# Patient Record
Sex: Female | Born: 1995 | Race: White | Hispanic: No | Marital: Single | Smoking: Current every day smoker
Health system: Southern US, Community
[De-identification: ages and names within clinical notes are randomized; demographics above are authoritative.]

## PROBLEM LIST (undated history)

## (undated) DIAGNOSIS — F419 Anxiety disorder, unspecified: Secondary | ICD-10-CM

## (undated) DIAGNOSIS — F32A Depression, unspecified: Secondary | ICD-10-CM

## (undated) DIAGNOSIS — F329 Major depressive disorder, single episode, unspecified: Secondary | ICD-10-CM

---
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
## 2015-04-29 ENCOUNTER — Ambulatory Visit
Admission: EM | Admit: 2015-04-29 | Discharge: 2015-04-29 | Disposition: A | Discharge status: Home / Self Care | Payer: Self-pay | Attending: Family Medicine | Admitting: Family Medicine

## 2015-04-29 ENCOUNTER — Ambulatory Visit (INDEPENDENT_AMBULATORY_CARE_PROVIDER_SITE_OTHER): Payer: BLUE CROSS/BLUE SHIELD

## 2015-04-29 DIAGNOSIS — S81011A Laceration without foreign body, right knee, initial encounter: Secondary | ICD-10-CM

## 2015-04-29 DIAGNOSIS — T07XXXA Unspecified multiple injuries, initial encounter: Secondary | ICD-10-CM

## 2015-04-29 DIAGNOSIS — S8001XA Contusion of right knee, initial encounter: Secondary | ICD-10-CM

## 2015-04-29 DIAGNOSIS — T148 Other injury of unspecified body region: Secondary | ICD-10-CM

## 2015-04-29 MED ORDER — TETANUS-DIPHTH-ACELL PERTUSSIS 5-2.5-18.5 LF-MCG/0.5 IM SUSP
0.5000 mL | Freq: Once | INTRAMUSCULAR | Status: AC
  Administered 2015-04-29: 0.5 mL via INTRAMUSCULAR

## 2015-04-29 MED ORDER — LIDOCAINE-EPINEPHRINE-TETRACAINE (LET) SOLUTION
3.0000 mL | Freq: Once | NASAL | Status: AC
  Administered 2015-04-29: 3 mL via TOPICAL

## 2015-04-29 MED ORDER — HYDROCODONE-ACETAMINOPHEN 5-325 MG PO TABS: 1.0000 | ORAL_TABLET | Freq: Three times a day (TID) | ORAL | Status: AC | PRN

## 2015-04-29 MED ORDER — MUPIROCIN 2 % EX OINT: 1.0000 "application " | TOPICAL_OINTMENT | Freq: Three times a day (TID) | CUTANEOUS | Status: AC

## 2015-04-29 NOTE — ED Notes (Signed)
Also has large abrasion to right lateral thigh

## 2015-04-29 NOTE — ED Notes (Signed)
States slipped on concrete this afternoon and has dime size abrasion to right knee and approximately 1cm laceration

## 2015-04-29 NOTE — ED Notes (Signed)
Boyfriend remains at bedside with patient. [facility] continues to [facility], [facility] Offered to give Toradol injection and patient asked "is that all you are going to give me?". Informed that will be given an Rx as discussed by Dr. Thurmond ButtsWade with patient.

## 2015-04-29 NOTE — ED Provider Notes (Signed)
CSN: [id number]     Arrival date & time 04/29/15  1448 History   First MD Initiated Contact with Patient 04/29/15 1632    Nurses notes were reviewed. Chief Complaint  Patient presents with  . Laceration   (Consider location/radiation/quality/duration/timing/severity/associated sxs/prior Treatment) Patient is a 19 y.o. female presenting with skin laceration. The history is provided by the patient and a significant other. No language interpreter was used.  Laceration Location:  Leg Leg laceration location:  R knee Depth:  Through dermis Quality: straight   Time since incident:  2 hours Laceration mechanism:  Fall Pain details:    Quality:  Sharp, shooting, aching and throbbing   Severity:  Severe   Timing:  Constant   Progression:  Worsening Foreign body present:  No foreign bodies Relieved by:  Nothing Ineffective treatments:  None tried Tetanus status:  Out of date   Past Medical History  Diagnosis Date  . Depression   . Anxiety    History reviewed. No pertinent past surgical history. Family History  Problem Relation Age of Onset  . Heart failure Father   . Diabetes Father   . Hypertension Father    Social History  Substance Use Topics  . Smoking status: Current Every Day Smoker -- 0.50 packs/day    Types: Cigarettes  . Smokeless tobacco: Never Used  . Alcohol Use: No   OB History    No data available     Review of Systems  Psychiatric/Behavioral: Positive for behavioral problems. The patient is nervous/anxious.   All other systems reviewed and are negative.   Allergies  Review of patient's allergies indicates no known allergies.  Home Medications   Prior to Admission medications   Medication Sig Start Date End Date Taking? Authorizing Provider  FLUoxetine (PROZAC) 20 MG tablet Take 20 mg by mouth 2 (two) times daily.    Historical Provider, MD  HYDROcodone-acetaminophen (NORCO) 5-325 MG tablet Take 1 tablet by mouth every 8 (eight) hours as needed for  moderate pain. 04/29/15   Hassan Rowan, MD  mupirocin ointment (BACTROBAN) 2 % Apply 1 application topically 3 (three) times daily. 04/29/15   Hassan Rowan, MD  naproxen (NAPROSYN) 500 MG tablet Take 1 tablet (500 mg total) by mouth 2 (two) times daily. 12/05/14   Eustace Moore, MD  norgestimate-ethinyl estradiol (ORTHO-CYCLEN,SPRINTEC,PREVIFEM) 0.25-35 MG-MCG tablet Take 1 tablet by mouth daily.    Historical Provider, MD   Meds Ordered and Administered this Visit   Medications  Tdap (BOOSTRIX) injection 0.5 mL (0.5 mLs Intramuscular Given 04/29/15 1555)  lidocaine-EPINEPHrine-tetracaine (LET) solution (3 mLs Topical Given 04/29/15 1540)    BP 116/72 mmHg  Pulse 106  Temp(Src) 97.3 F (36.3 C) (Tympanic)  Resp 16  Ht  (1.676 m)  Wt 138 lb (62.596 kg)  BMI 22.28 kg/m2  SpO2 100%  LMP 04/09/2015 No data found.   Physical Exam  Constitutional: She is oriented to person, place, and time. She appears well-developed and well-nourished.  HENT:  Head: Normocephalic.  Eyes: Pupils are equal, round, and reactive to light.  Neck: Normal range of motion.  Musculoskeletal: She exhibits tenderness.  Neurological: She is alert and oriented to person, place, and time.  Skin: Skin is warm. Abrasion and laceration noted.     Psychiatric: She has a normal mood and affect.  Vitals reviewed.   ED Course  .Marland KitchenLaceration Repair Date/Time: 04/29/2015 5:06 PM Performed by: Hassan Rowan Authorized by: Hassan Rowan Consent: Verbal consent obtained. Body area: lower extremity Location  details: right knee Laceration length: 6 cm Tendon involvement: none Nerve involvement: superficial Vascular damage: no Anesthesia: local infiltration Local anesthetic: lidocaine 1% with epinephrine and LET (lido,epi,tetracaine) Patient sedated: no Amount of cleaning: standard Debridement: none Degree of undermining: none Skin closure: staples (5) Technique: simple Approximation: close Approximation  difficulty: simple Dressing: 4x4 sterile gauze and antibiotic ointment Comments: Patient demonstrated high anxiety during the procedure removing the dressing for the left cause her to have a significant amount of anxiety and consternation. During the stapling despite after having lidocaine injected patient is a plain of significant amount of pain significant amount of fear and repeatedly asked for pain medication after the procedure was done.   (including critical care time)  Labs Review Labs Reviewed - No data to display  Imaging Review Dg Knee Complete 4 Views Right  04/29/2015  CLINICAL DATA:  19 year old female with history of trauma from falling down porch steps today with contusion and laceration on the right knee. EXAM: RIGHT KNEE - COMPLETE 4+ VIEW COMPARISON:  No priors. FINDINGS: Four views of the right knee demonstrate no acute displaced fracture, subluxation or dislocation. Multiple skin staples are noted anterior to the knee joint, presumably within the reported laceration. Small well-defined sclerotic lesion with narrow zone of transition in the proximal third of the tibial diaphysis may represent a bone island or a healed fibrous cortical defect. IMPRESSION: 1. No evidence of significant acute traumatic injury to the bones of the right knee. Electronically Signed   By: Trudie Reedaniel  Entrikin M.D.   On: 04/29/2015 17:19     Visual Acuity Review  Right Eye Distance:   Left Eye Distance:   Bilateral Distance:    Right Eye Near:   Left Eye Near:    Bilateral Near:         MDM   1. Knee contusion, right, initial encounter   2. Knee laceration, right, initial encounter   3. Abrasion, multiple sites    Patient x-rays were negative will let her go with some Vicodin and Bactroban ointment. She requested crutches which I declined at this time.      Hassan RowanEugene Baylor Teegarden, MD 04/29/15 [phone]

## 2015-04-29 NOTE — Discharge Instructions (Signed)
Cryotherapy Cryotherapy is when you put ice on your injury. Ice helps lessen pain and puffiness (swelling) after an injury. Ice works the best when you start using it in the first 24 to 48 hours after an injury. HOME CARE  Put a dry or damp towel between the ice pack and your skin.  You may press gently on the ice pack.  Leave the ice on for no more than 10 to 20 minutes at a time.  Check your skin after 5 minutes to make sure your skin is okay.  Rest at least 20 minutes between ice pack uses.  Stop using ice when your skin loses feeling (numbness).  Do not use ice on someone who cannot tell you when it hurts. This includes small children and people with memory problems (dementia). GET HELP RIGHT AWAY IF:  You have white spots on your skin.  Your skin turns blue or pale.  Your skin feels waxy or hard.  Your puffiness gets worse. MAKE SURE YOU:   Understand these instructions.  Will watch your condition.  Will get help right away if you are not doing well or get worse.   This information is not intended to replace advice given to you by your health care provider. Make sure you discuss any questions you have with your health care provider.   Document Released: 11/03/2007 Document Revised: 08/09/2011 Document Reviewed: 01/07/2011 Elsevier Interactive Patient Education 2016 [employer]  Laceration Care, Adult A laceration is a cut that goes through all layers of the skin. The cut also goes into the tissue that is right under the skin. Some cuts heal on their own. Others need to be closed with stitches (sutures), staples, skin adhesive strips, or wound glue. Taking care of your cut lowers your risk of infection and helps your cut to heal better. HOW TO TAKE CARE OF YOUR CUT For stitches or staples:  Keep the wound clean and dry.  If you were given a bandage (dressing), you should change it at least one time per day or as told by your doctor. You should also change it if it  gets wet or dirty.  Keep the wound completely dry for the first 24 hours or as told by your doctor. After that time, you may take a shower or a bath. However, make sure that the wound is not soaked in water until after the stitches or staples have been removed.  Clean the wound one time each day or as told by your doctor:  Wash the wound with soap and water.  Rinse the wound with water until all of the soap comes off.  Pat the wound dry with a clean towel. Do not rub the wound.  After you clean the wound, put a thin layer of antibiotic ointment on it as told by your doctor. This ointment:  Helps to prevent infection.  Keeps the bandage from sticking to the wound.  Have your stitches or staples removed as told by your doctor. If your doctor used skin adhesive strips:   Keep the wound clean and dry.  If you were given a bandage, you should change it at least one time per day or as told by your doctor. You should also change it if it gets dirty or wet.  Do not get the skin adhesive strips wet. You can take a shower or a bath, but be careful to keep the wound dry.  If the wound gets wet, pat it dry with a clean  towel. Do not rub the wound.  Skin adhesive strips fall off on their own. You can trim the strips as the wound heals. Do not remove any strips that are still stuck to the wound. They will fall off after a while. If your doctor used wound glue:  Try to keep your wound dry, but you may briefly wet it in the shower or bath. Do not soak the wound in water, such as by swimming.  After you take a shower or a bath, gently pat the wound dry with a clean towel. Do not rub the wound.  Do not do any activities that will make you really sweaty until the skin glue has fallen off on its own.  Do not apply liquid, cream, or ointment medicine to your wound while the skin glue is still on.  If you were given a bandage, you should change it at least one time per day or as told by your  doctor. You should also change it if it gets dirty or wet.  If a bandage is placed over the wound, do not let the tape for the bandage touch the skin glue.  Do not pick at the glue. The skin glue usually stays on for 5-10 days. Then, it falls off of the skin. General Instructions  To help prevent scarring, make sure to cover your wound with sunscreen whenever you are outside after stitches are removed, after adhesive strips are removed, or when wound glue stays in place and the wound is healed. Make sure to wear a sunscreen of at least 30 SPF.  Take over-the-counter and prescription medicines only as told by your doctor.  If you were given antibiotic medicine or ointment, take or apply it as told by your doctor. Do not stop using the antibiotic even if your wound is getting better.  Do not scratch or pick at the wound.  Keep all follow-up visits as told by your doctor. This is important.  Check your wound every day for signs of infection. Watch for:  Redness, swelling, or pain.  Fluid, blood, or pus.  Raise (elevate) the injured area above the level of your heart while you are sitting or lying down, if possible. GET HELP IF:  You got a tetanus shot and you have any of these problems at the injection site:  Swelling.  Very bad pain.  Redness.  Bleeding.  You have a fever.  A wound that was closed breaks open.  You notice a bad smell coming from your wound or your bandage.  You notice something coming out of the wound, such as wood or glass.  Medicine does not help your pain.  You have more redness, swelling, or pain at the site of your wound.  You have fluid, blood, or pus coming from your wound.  You notice a change in the color of your skin near your wound.  You need to change the bandage often because fluid, blood, or pus is coming from the wound.  You start to have a new rash.  You start to have numbness around the wound. GET HELP RIGHT AWAY IF:  You  have very bad swelling around the wound.  Your pain suddenly gets worse and is very bad.  You notice painful lumps near the wound or on skin that is anywhere on your body.  You have a red streak going away from your wound.  The wound is on your hand or foot and you cannot move a finger or  toe like you usually can.  The wound is on your hand or foot and you notice that your fingers or toes look pale or bluish.   This information is not intended to replace advice given to you by your health care provider. Make sure you discuss any questions you have with your health care provider.   Document Released: 11/03/2007 Document Revised: 10/01/2014 Document Reviewed: 05/13/2014 Elsevier Interactive Patient Education 2016 [employer]  Wound Care Taking care of your wound properly can help to prevent pain and infection. It can also help your wound to heal more quickly.  HOW TO CARE FOR YOUR WOUND  Take or apply over-the-counter and prescription medicines only as told by your health care provider.  If you were prescribed antibiotic medicine, take or apply it as told by your health care provider. Do not stop using the antibiotic even if your condition improves.  Clean the wound each day or as told by your health care provider.  Wash the wound with mild soap and water.  Rinse the wound with water to remove all soap.  Pat the wound dry with a clean towel. Do not rub it.  There are many different ways to close and cover a wound. For example, a wound can be covered with stitches (sutures), skin glue, or adhesive strips. Follow instructions from your health care provider about:  How to take care of your wound.  When and how you should change your bandage (dressing).  When you should remove your dressing.  Removing whatever was used to close your wound.  Check your wound every day for signs of infection. Watch for:  Redness, swelling, or pain.  Fluid, blood, or pus.  Keep the  dressing dry until your health care provider says it can be removed. Do not take baths, swim, use a hot tub, or do anything that would put your wound underwater until your health care provider approves.  Raise (elevate) the injured area above the level of your heart while you are sitting or lying down.  Do not scratch or pick at the wound.  Keep all follow-up visits as told by your health care provider. This is important. SEEK MEDICAL CARE IF:  You received a tetanus shot and you have swelling, severe pain, redness, or bleeding at the injection site.  You have a fever.  Your pain is not controlled with medicine.  You have increased redness, swelling, or pain at the site of your wound.  You have fluid, blood, or pus coming from your wound.  You notice a bad smell coming from your wound or your dressing. SEEK IMMEDIATE MEDICAL CARE IF:  You have a red streak going away from your wound.   This information is not intended to replace advice given to you by your health care provider. Make sure you discuss any questions you have with your health care provider.   Document Released: 02/24/2008 Document Revised: 10/01/2014 Document Reviewed: 05/13/2014 Elsevier Interactive Patient Education 2016 [employer]  Sutured Wound Care Sutures are stitches that can be used to close wounds. Taking care of your wound properly can help prevent pain and infection. It can also help your wound to heal more quickly. HOW TO CARE FOR YOUR SUTURED WOUND Wound Care  Keep the wound clean and dry.  If you were given a bandage (dressing), change it at least one time per day or as told by your doctor. You should also change it if it gets wet or dirty.  Keep the  wound completely dry for the first 24 hours or as told by your doctor. After that time, you may shower or bathe. However, make sure that the wound is not soaked in water until the sutures have been removed.  Clean the wound one time each day or as  told by your doctor.  Wash the wound with soap and water.  Rinse the wound with water to remove all soap.  Pat the wound dry with a clean towel. Do not rub the wound.  After cleaning the wound, put a thin layer of antibiotic ointment on it as told your doctor. This ointment:  Helps to prevent infection.  Keeps the bandage from sticking to the wound.  Have the sutures removed as told by your doctor. General Instructions  Take or apply medicines only as told by your doctor.  To help prevent scarring, make sure to cover your wound with sunscreen whenever you are outside after the sutures are removed and the wound is healed. Make sure to wear a sunscreen of at least 30 SPF.  If you were prescribed an antibiotic medicine or ointment, finish all of it even if you start to feel better.  Do not scratch or pick at the wound.  Keep all follow-up visits as told by your doctor. This is important.  Check your wound every day for signs of infection. Watch for:  Redness, swelling, or pain.  Fluid, blood, or pus.  Raise (elevate) the injured area above the level of your heart while you are sitting or lying down, if possible.  Avoid stretching your wound.  Drink enough fluids to keep your pee (urine) clear or pale yellow. GET HELP IF:  You were given a tetanus shot and you have any of these where the needle went in:  Swelling.  Very bad pain.  Redness.  Bleeding.  You have a fever.  A wound that was closed breaks open.  You notice a bad smell coming from the wound.  You notice something coming out of the wound, such as wood or glass.  Medicine does not help your pain.  You have any of these at the site of the wound.  More redness.  More swelling.  More pain.  You have any of these coming from the wound.  Fluid.  Blood.  Pus.  You notice a change in the color of your skin near the wound.  You need to change the bandage often due to fluid, blood, or pus  coming from the wound.  You have a new rash.  You have numbness around the wound. GET HELP RIGHT AWAY IF:  You have very bad swelling around the wound.  Your pain suddenly gets worse and is very bad.  You have painful lumps near the wound or on skin that is anywhere on your body.  You have a red streak going away from the wound.  The wound is on your hand or foot and you cannot move a finger or toe like normal.  The wound is on your hand or foot and you notice that your fingers or toes look pale or bluish.   This information is not intended to replace advice given to you by your health care provider. Make sure you discuss any questions you have with your health care provider.   Document Released: 11/03/2007 Document Revised: 10/01/2014 Document Reviewed: 12/27/2012 Elsevier Interactive Patient Education [employer]

## 2015-05-03 ENCOUNTER — Ambulatory Visit
Admission: EM | Admit: 2015-05-03 | Discharge: 2015-05-03 | Disposition: A | Discharge status: Home / Self Care | Payer: BLUE CROSS/BLUE SHIELD | Attending: Family Medicine | Admitting: Family Medicine

## 2015-05-03 ENCOUNTER — Encounter: Payer: Self-pay | Admitting: Emergency Medicine

## 2015-05-03 DIAGNOSIS — S81001D Unspecified open wound, right knee, subsequent encounter: Secondary | ICD-10-CM | POA: Diagnosis not present

## 2015-05-03 NOTE — ED Provider Notes (Addendum)
CSN: [id number]     Arrival date & time 05/03/15  1456 History   First MD Initiated Contact with Patient 05/03/15 1508     Chief Complaint  Patient presents with  . Knee Pain   (Consider location/radiation/quality/duration/timing/severity/associated sxs/prior Treatment) HPI  19 year old female presents to urgent care today requesting a note for work.  Patient was recently seen on 11/29 after suffering an injury. She suffered a laceration to her right knee which was closed with staples. She was discharged home with supportive care and Vicodin for pain. She was excused from work until 12/1.  Patient reports that since that time she's continued to have pain particularly with ambulation. She is unable to work as she uses crutches and is unable to perform her duties as a result. She reports she continues to have pain. She states that her wound is healing well. She is requesting a letter for work that indicates her restrictions.  Past Medical History  Diagnosis Date  . Depression   . Anxiety    History reviewed. No pertinent past surgical history. Family History  Problem Relation Age of Onset  . Heart failure Father   . Diabetes Father   . Hypertension Father    Social History  Substance Use Topics  . Smoking status: Current Every Day Smoker -- 0.50 packs/day    Types: Cigarettes  . Smokeless tobacco: Never Used  . Alcohol Use: No   OB History    No data available     Review of Systems  Musculoskeletal:       Knee pain.  Skin: Positive for wound.    Allergies  Review of patient's allergies indicates no known allergies.  Home Medications   Prior to Admission medications   Medication Sig Start Date End Date Taking? Authorizing Provider  HYDROcodone-acetaminophen (NORCO) 5-325 MG tablet Take 1 tablet by mouth every 8 (eight) hours as needed for moderate pain. 04/29/15  Yes Hassan RowanEugene Wade, MD  mupirocin ointment (BACTROBAN) 2 % Apply 1 application topically 3 (three) times  daily. 04/29/15  Yes Hassan RowanEugene Wade, MD  FLUoxetine (PROZAC) 20 MG tablet Take 20 mg by mouth 2 (two) times daily.    Historical Provider, MD  naproxen (NAPROSYN) 500 MG tablet Take 1 tablet (500 mg total) by mouth 2 (two) times daily. 12/05/14   Eustace MooreLaura W Murray, MD  norgestimate-ethinyl estradiol (ORTHO-CYCLEN,SPRINTEC,PREVIFEM) 0.25-35 MG-MCG tablet Take 1 tablet by mouth daily.    Historical Provider, MD   Meds Ordered and Administered this Visit  Medications - No data to display  BP 98/56 mmHg  Pulse 110  Temp(Src) 97.8 F (36.6 C) (Tympanic)  Resp 16  Ht 5\' 6"  (1.676 m)  Wt 138 lb (62.596 kg)  BMI 22.28 kg/m2  SpO2 100%  LMP 04/09/2015 No data found.   Physical Exam  Constitutional: She appears well-developed. No distress.  HENT:  Head: Normocephalic and atraumatic.  Eyes: No scleral icterus.  Pulmonary/Chest: Effort normal.  Neurological: She is alert.  Skin:  Right knee - 5 staples in place and wound is healing well. She has an abrasion above the laceration which is also healing well.  Psychiatric: She has a normal mood and affect.  Vitals reviewed.  ED Course  Procedures (including critical care time)  Labs Review Labs Reviewed - No data to display  Imaging Review No results found.  MDM   1. Open knee wound, right, subsequent encounter    Work note was provided indicating restrictions. Advised continued use of over-the-counter medications for  pain. Wound was redressed and patient was given Tegaderm as well as Tefla for dressing her wound.  15 minutes were spent face-to-face with the patient during this encounter and over half of that time was spent on counseling and coordination of care.      Tommie Sams, DO 05/03/15 1613  Tommie Sams, DO 05/03/15 1614

## 2015-05-03 NOTE — ED Notes (Addendum)
Injured right knee  On April 29, 2015 at work. Need note for work for limitations.

## 2015-05-03 NOTE — Discharge Instructions (Signed)
Please return for suture removal next week.  Take care  Dr. Adriana Simasook

## 2015-05-12 ENCOUNTER — Ambulatory Visit
Admission: EM | Admit: 2015-05-12 | Discharge: 2015-05-12 | Disposition: A | Discharge status: Home / Self Care | Payer: BLUE CROSS/BLUE SHIELD

## 2015-05-12 NOTE — ED Notes (Signed)
Staple removal

## 2015-08-27 ENCOUNTER — Ambulatory Visit
Admission: EM | Admit: 2015-08-27 | Discharge: 2015-08-27 | Disposition: A | Discharge status: Home / Self Care | Payer: Self-pay | Attending: Family Medicine | Admitting: Family Medicine

## 2015-08-27 ENCOUNTER — Encounter: Payer: Self-pay | Admitting: *Deleted

## 2015-08-27 DIAGNOSIS — B349 Viral infection, unspecified: Secondary | ICD-10-CM

## 2015-08-27 LAB — URINALYSIS COMPLETE WITH MICROSCOPIC (ARMC ONLY)
Glucose, UA: NEGATIVE mg/dL
LEUKOCYTES UA: NEGATIVE
Nitrite: NEGATIVE
PH: 6 (ref 5.0–8.0)
Protein, ur: 100 mg/dL — AB
Specific Gravity, Urine: 1.025 (ref 1.005–1.030)

## 2015-08-27 LAB — PREGNANCY, URINE: Preg Test, Ur: NEGATIVE

## 2015-08-27 LAB — RAPID INFLUENZA A&B ANTIGENS
Influenza A (ARMC): NEGATIVE
Influenza B (ARMC): NEGATIVE

## 2015-08-27 LAB — RAPID STREP SCREEN (MED CTR MEBANE ONLY): STREPTOCOCCUS, GROUP A SCREEN (DIRECT): NEGATIVE

## 2015-08-27 MED ORDER — OSELTAMIVIR PHOSPHATE 75 MG PO CAPS: 75.0000 mg | ORAL_CAPSULE | Freq: Two times a day (BID) | ORAL | Status: AC

## 2015-08-27 MED ORDER — ACETAMINOPHEN 500 MG PO TABS
1000.0000 mg | ORAL_TABLET | Freq: Once | ORAL | Status: AC
  Administered 2015-08-27: 1000 mg via ORAL

## 2015-08-27 NOTE — ED Notes (Signed)
Fever, nausea, headache, chills, and body aches x2 days.

## 2015-08-27 NOTE — Discharge Instructions (Signed)
Monitor fever closely, rest, increase hydration, follow up with primary care physician tomorrow, if symptoms worsen go to ER as discussed. Tamiflu sent to the pharmacy. Tylenol/Motrin when necessary.

## 2015-08-27 NOTE — ED Provider Notes (Signed)
CSN: [id number]     Arrival date & time 08/27/15  1919 History   First MD Initiated Contact with Patient 08/27/15 1941     Chief Complaint  Patient presents with  . Fever  . Nausea  . Headache  . Chills  . Generalized Body Aches   (Consider location/radiation/quality/duration/timing/severity/associated sxs/prior Treatment) HPI: Patient presents today with symptoms of fever, nausea, headache, myalgias for 2 days. She states she has a minimal cough and no sore throat. She denies any abdominal pain, urinary symptoms, diarrhea, vomiting, chest pain, shortness of breath, neck stiffness, photophobia, vaginal discharge. Patient states that she does have a history of tachycardia but was unable to tolerate propranolol. She also states that she usually has a low blood pressure. She does not have any symptoms of dizziness/syncope. She is currently on her menstrual cycle.  Past Medical History  Diagnosis Date  . Depression   . Anxiety    History reviewed. No pertinent past surgical history. Family History  Problem Relation Age of Onset  . Heart failure Father   . Diabetes Father   . Hypertension Father    Social History  Substance Use Topics  . Smoking status: Current Every Day Smoker -- 0.50 packs/day    Types: Cigarettes  . Smokeless tobacco: Never Used  . Alcohol Use: No   OB History    No data available     Review of Systems: Negative except mentioned above.   Allergies  Review of patient's allergies indicates no known allergies.  Home Medications   Prior to Admission medications   Medication Sig Start Date End Date Taking? Authorizing Provider  propranolol (INDERAL) 10 MG tablet Take 10 mg by mouth 3 (three) times daily.   Yes Historical Provider, MD  FLUoxetine (PROZAC) 20 MG tablet Take 20 mg by mouth 2 (two) times daily.    Historical Provider, MD  HYDROcodone-acetaminophen (NORCO) 5-325 MG tablet Take 1 tablet by mouth every 8 (eight) hours as needed for moderate pain.  04/29/15   Hassan RowanEugene Wade, MD  mupirocin ointment (BACTROBAN) 2 % Apply 1 application topically 3 (three) times daily. 04/29/15   Hassan RowanEugene Wade, MD  naproxen (NAPROSYN) 500 MG tablet Take 1 tablet (500 mg total) by mouth 2 (two) times daily. 12/05/14   Eustace MooreLaura W Murray, MD  norgestimate-ethinyl estradiol (ORTHO-CYCLEN,SPRINTEC,PREVIFEM) 0.25-35 MG-MCG tablet Take 1 tablet by mouth daily.    Historical Provider, MD   Meds Ordered and Administered this Visit   Medications  acetaminophen (TYLENOL) tablet 1,000 mg (1,000 mg Oral Given 08/27/15 2003)    BP 95/59 mmHg  Pulse 124  Temp(Src) 100.8 F (38.2 C) (Oral)  Resp 16  Ht 5\' 6"  (1.676 m)  Wt 139 lb (63.05 kg)  BMI 22.45 kg/m2  SpO2 99%  LMP 08/26/2015 (Exact Date) No data found.   Physical Exam:  GENERAL: NAD HEENT: minimal pharyngeal erythema, no exudate, no erythema of TMs, no cervical LAD RESP: CTA B CARD: tachycardia ABD: +BS, NT, no rebound or guarding  NEURO: AAO, CN II-XII grossly intact, negative meningeal signs   ED Course  Procedures (including critical care time)  Labs Review Labs Reviewed  RAPID INFLUENZA A&B ANTIGENS ([facility] ONLY)  RAPID STREP SCREEN (NOT AT [facility])  CULTURE, GROUP A STREP [facility])    Imaging Review No results found.     MDM  A/P: Viral Illness-rapid strep test and flu screen were negative, urine sent for culture, Tamiflu, rest, hydration, if symptoms do worsen I do recommend that patient go to  the ER as discussed. She is to follow-up with her primary care physician tomorrow. She is to continue taking Tylenol/Motrin when necessary and monitoring her fever closely.    Jolene Provost, MD 08/27/15 2048

## 2015-08-29 LAB — CULTURE, GROUP A STREP (THRC)

## 2015-08-30 LAB — URINE CULTURE

## 2017-07-23 IMAGING — CR DG KNEE COMPLETE 4+V*R*
4 series · 4 of 4 positions shown · non-contrast
Comparison: No priors.

CLINICAL DATA: 19-year-old female with history of trauma from
falling down porch steps today with contusion and laceration on the
right knee.

EXAM:
RIGHT KNEE - COMPLETE 4+ VIEW

[knee ap]
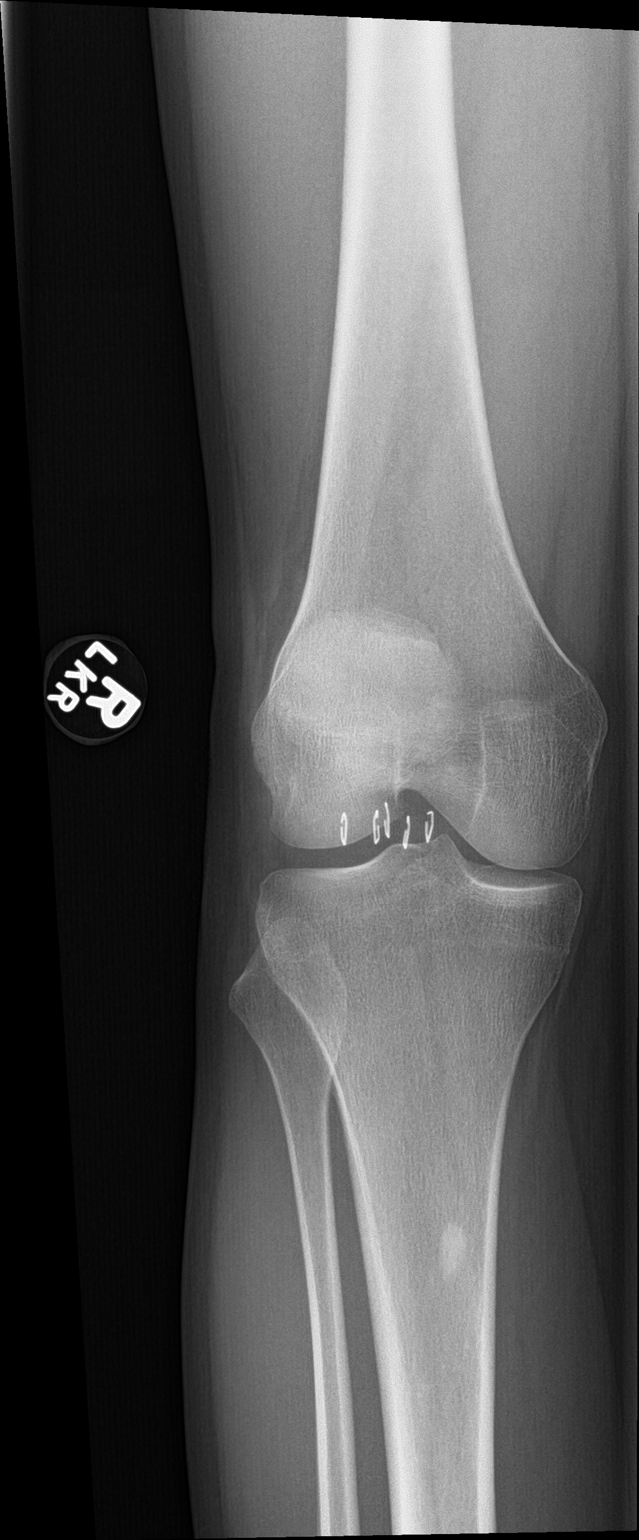

[tunnel]
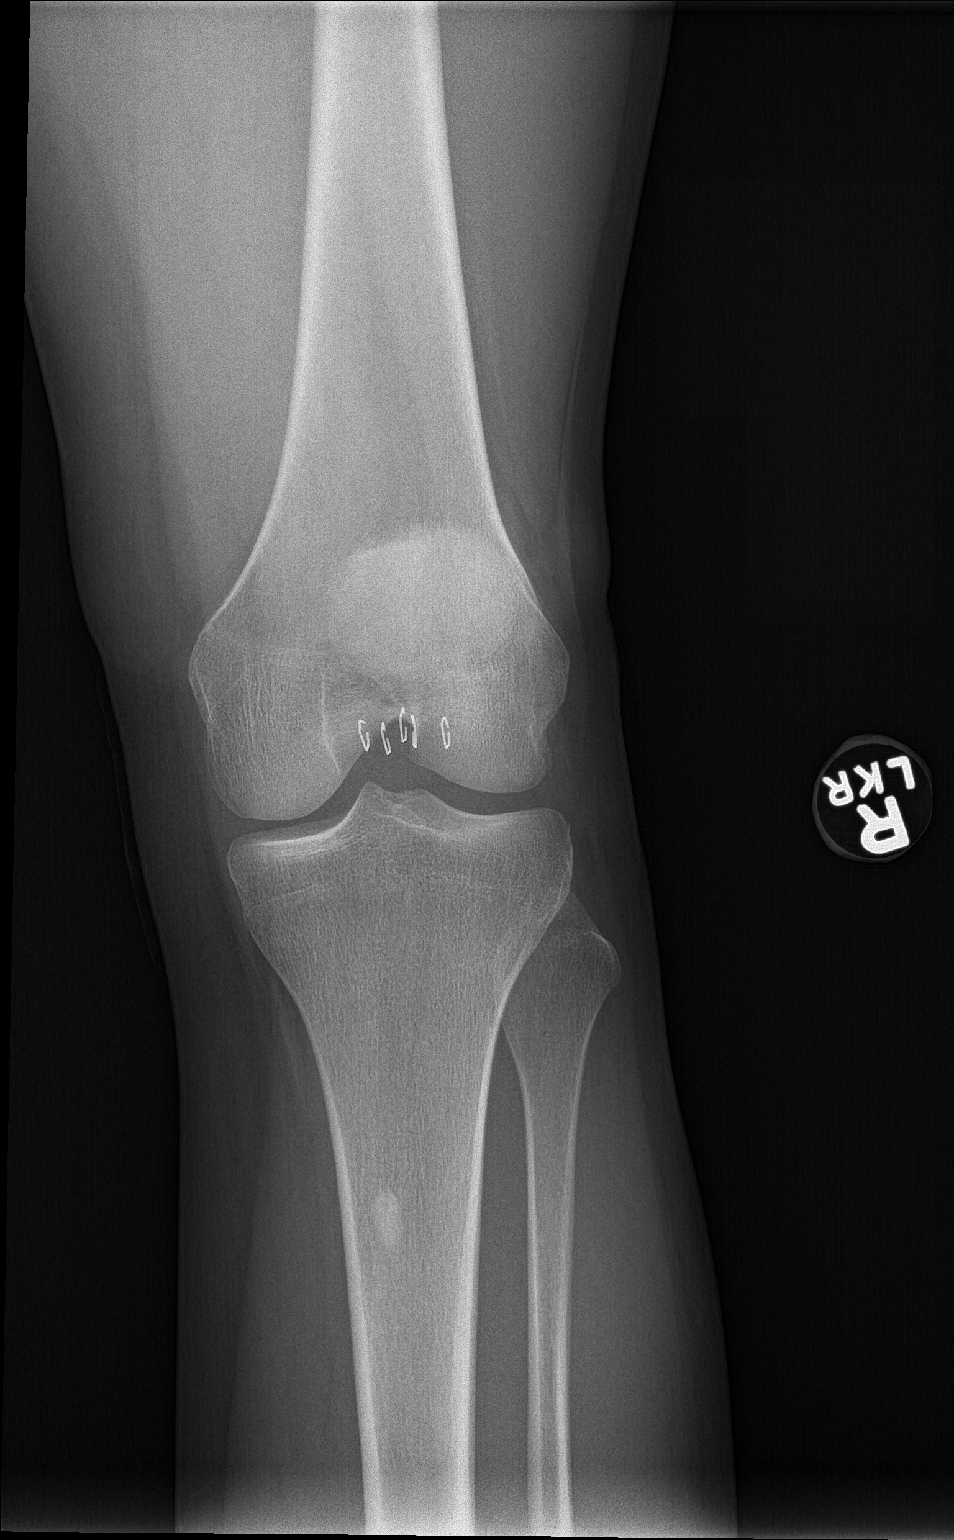

[knee lat]
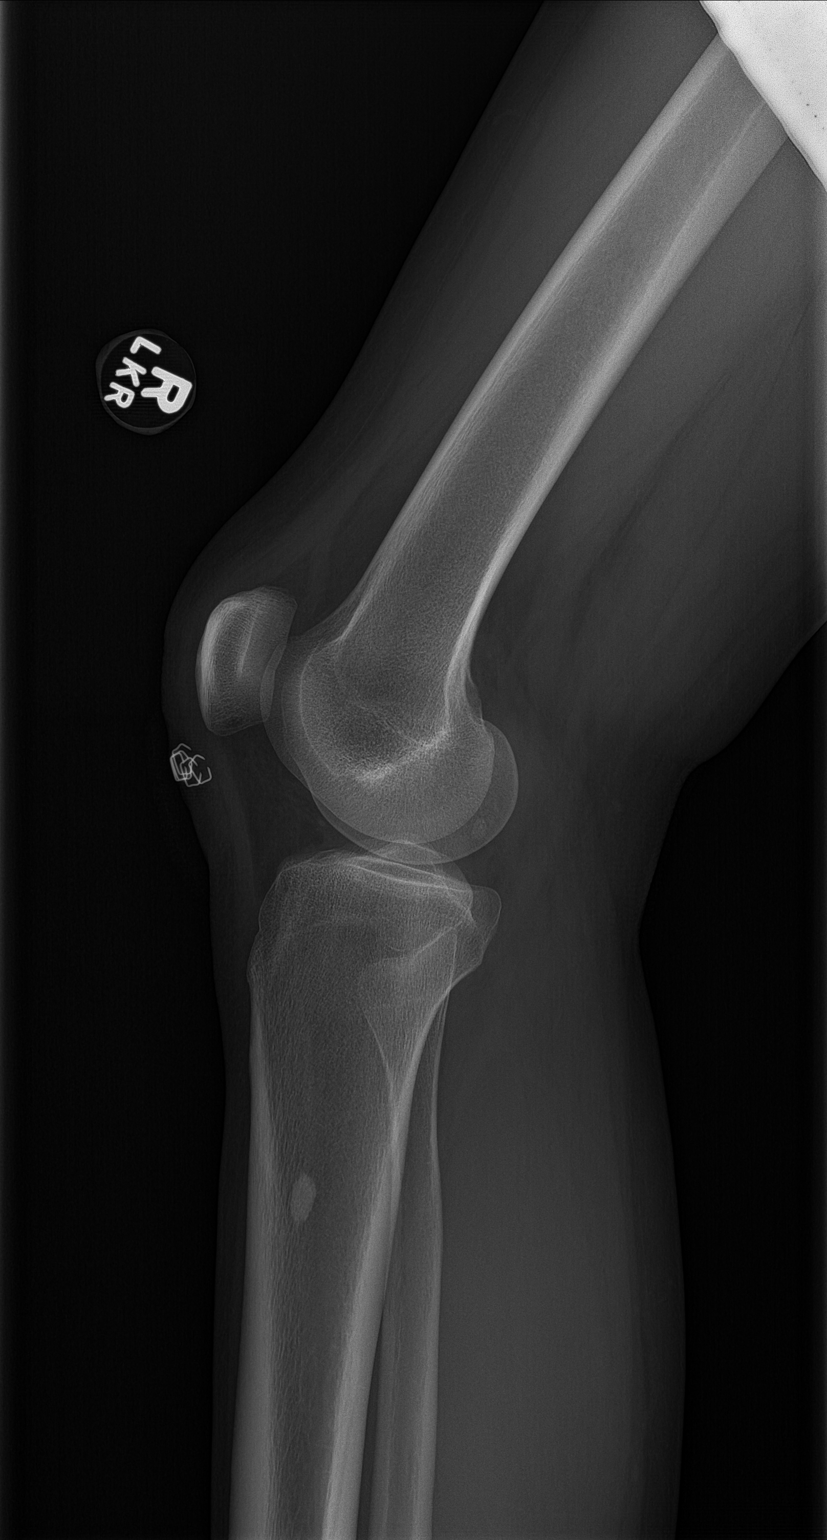

[patella skyline]
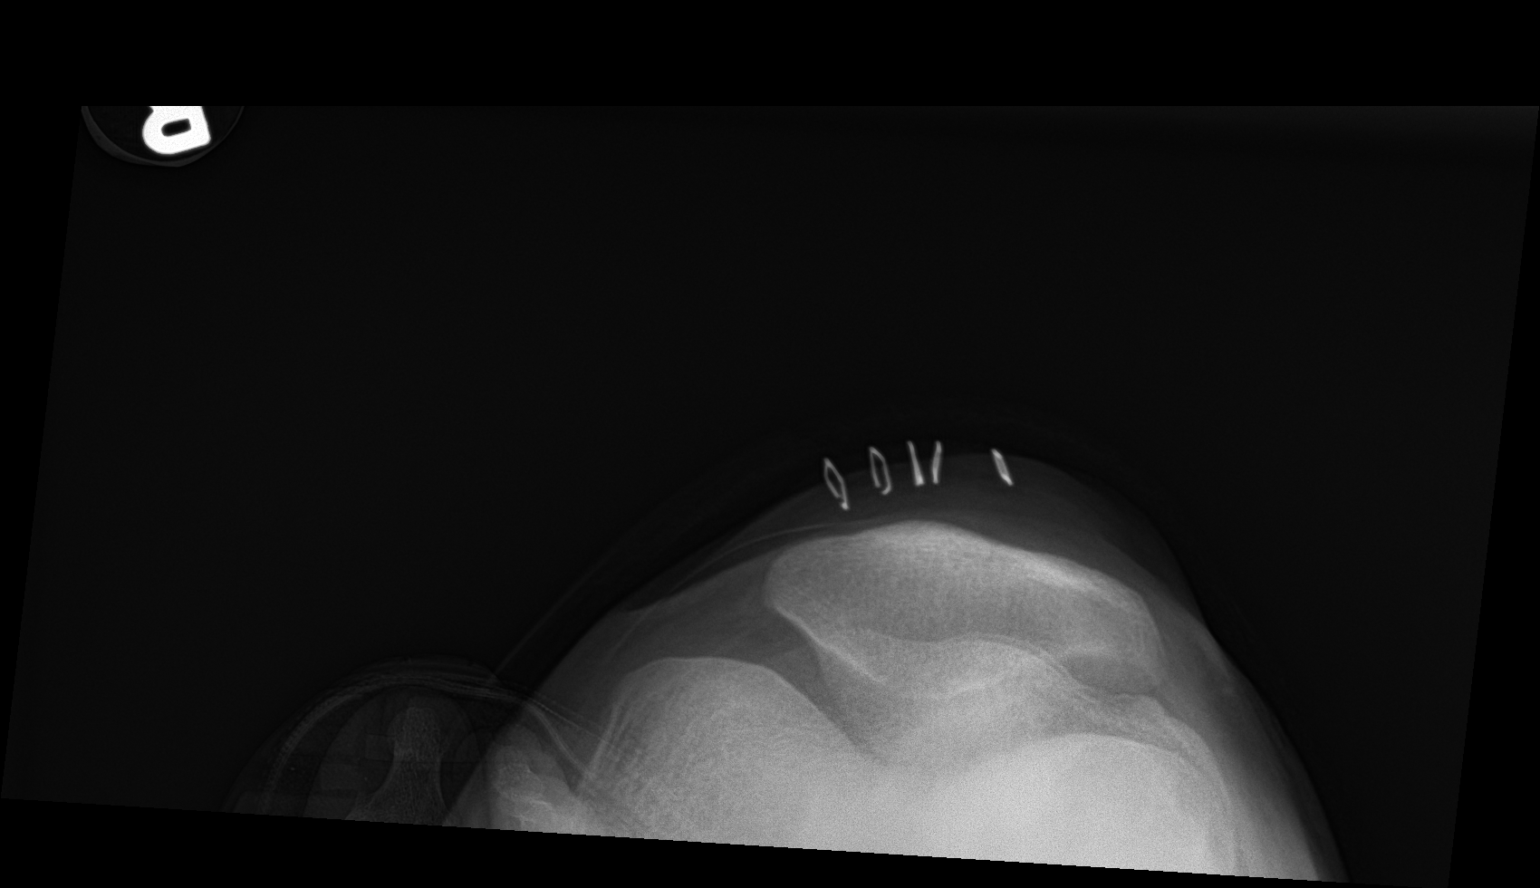

[4 of 4 positions shown; findings below may reference images not displayed]

FINDINGS: Four views of the right knee demonstrate no acute displaced
fracture, subluxation or dislocation. Multiple skin staples are
noted anterior to the knee joint, presumably within the reported
laceration. Small well-defined sclerotic lesion with narrow zone of
transition in the proximal third of the tibial diaphysis may
represent a bone island or a healed fibrous cortical defect.
IMPRESSION: 1. No evidence of significant acute traumatic injury to the bones of
the right knee.

## 2023-08-04 ENCOUNTER — Other Ambulatory Visit (HOSPITAL_COMMUNITY): Payer: Self-pay
